# Patient Record
Sex: Female | Born: 1945 | Race: White
Health system: Southern US, Community
[De-identification: ages and names within clinical notes are randomized; demographics above are authoritative.]

---

## 2013-08-27 ENCOUNTER — Other Ambulatory Visit: Payer: Self-pay | Admitting: Neurosurgery

## 2013-08-27 DIAGNOSIS — M47812 Spondylosis without myelopathy or radiculopathy, cervical region: Secondary | ICD-10-CM

## 2013-09-07 ENCOUNTER — Ambulatory Visit
Admission: RE | Admit: 2013-09-07 | Discharge: 2013-09-07 | Disposition: A | Discharge status: Home / Self Care | Payer: Medicare Other | Source: Ambulatory Visit | Attending: Neurosurgery | Admitting: Neurosurgery

## 2013-09-07 DIAGNOSIS — M47812 Spondylosis without myelopathy or radiculopathy, cervical region: Secondary | ICD-10-CM

## 2015-01-12 IMAGING — CR DG CERVICAL SPINE 2 OR 3 VIEWS
2 series · 2 of 2 positions shown · non-contrast
Comparison: None.

CLINICAL DATA: Bilateral upper extremity tingling without history
of trauma

EXAM:
CERVICAL SPINE - 2-3 VIEW

[view not recorded (1 of 2)]
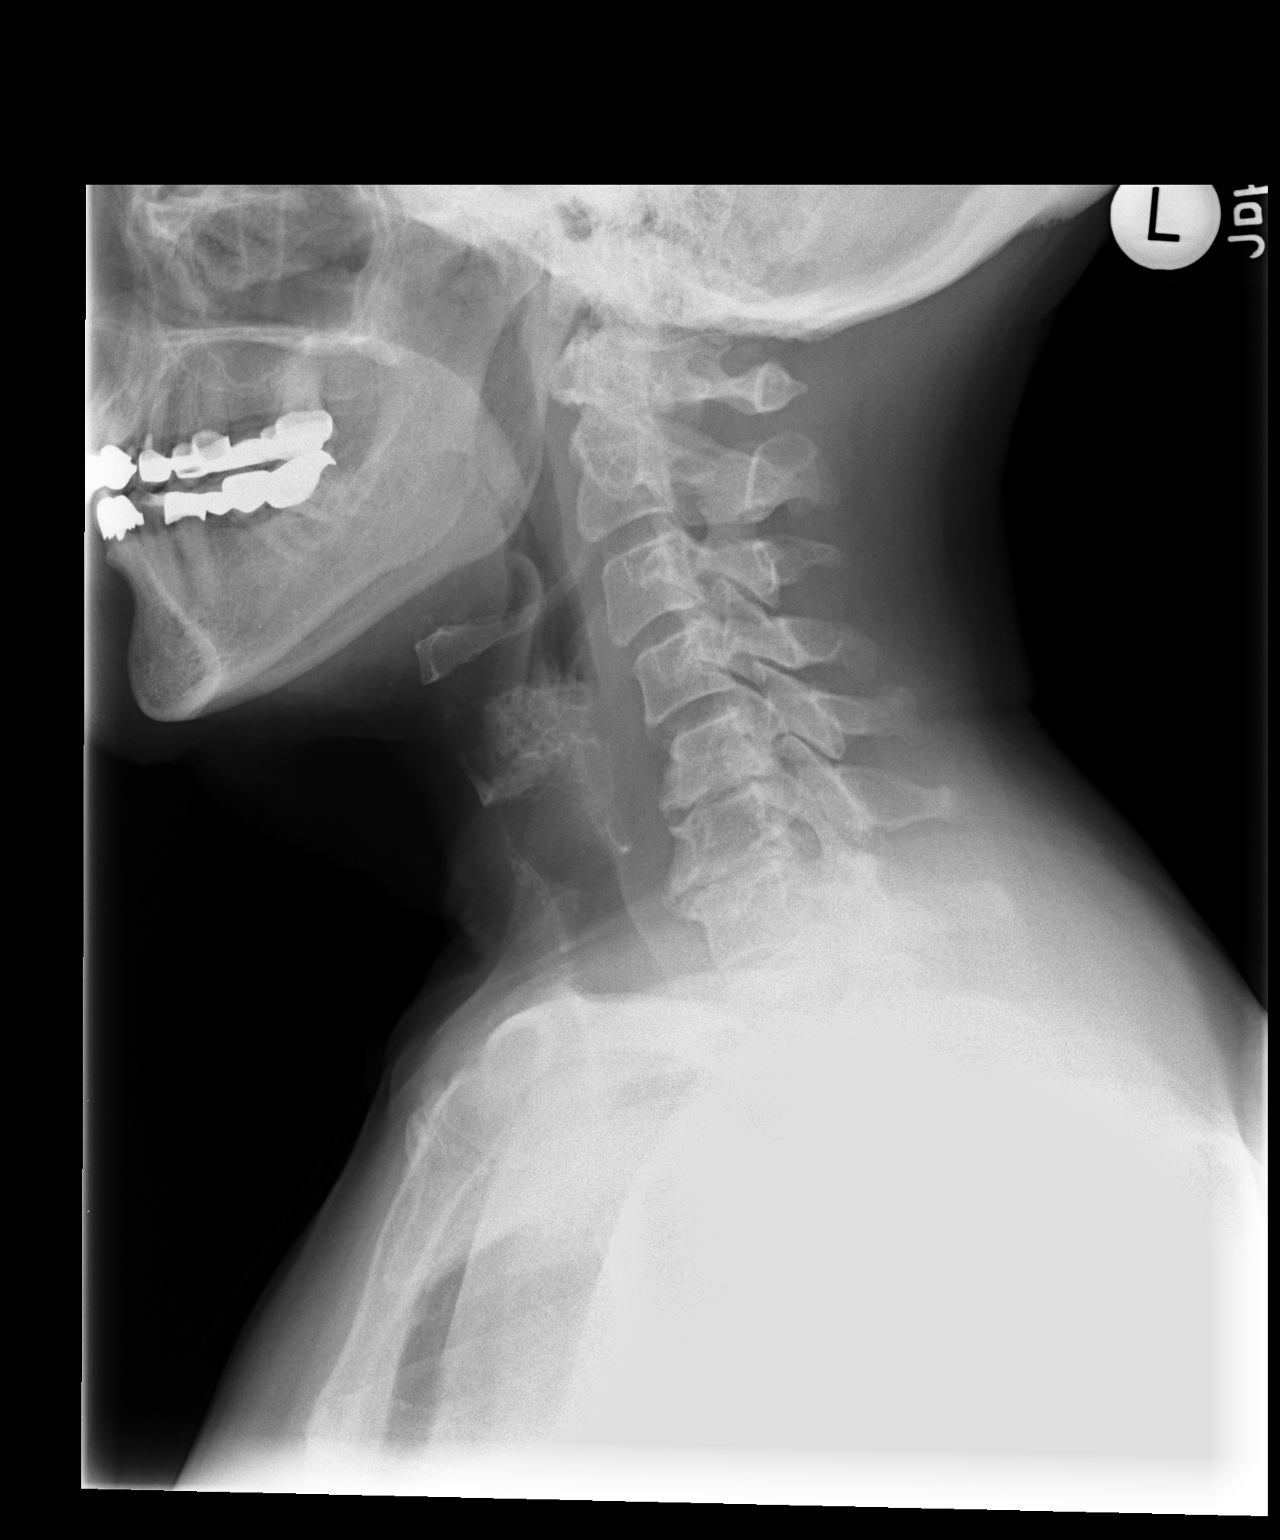

[view not recorded (2 of 2)]
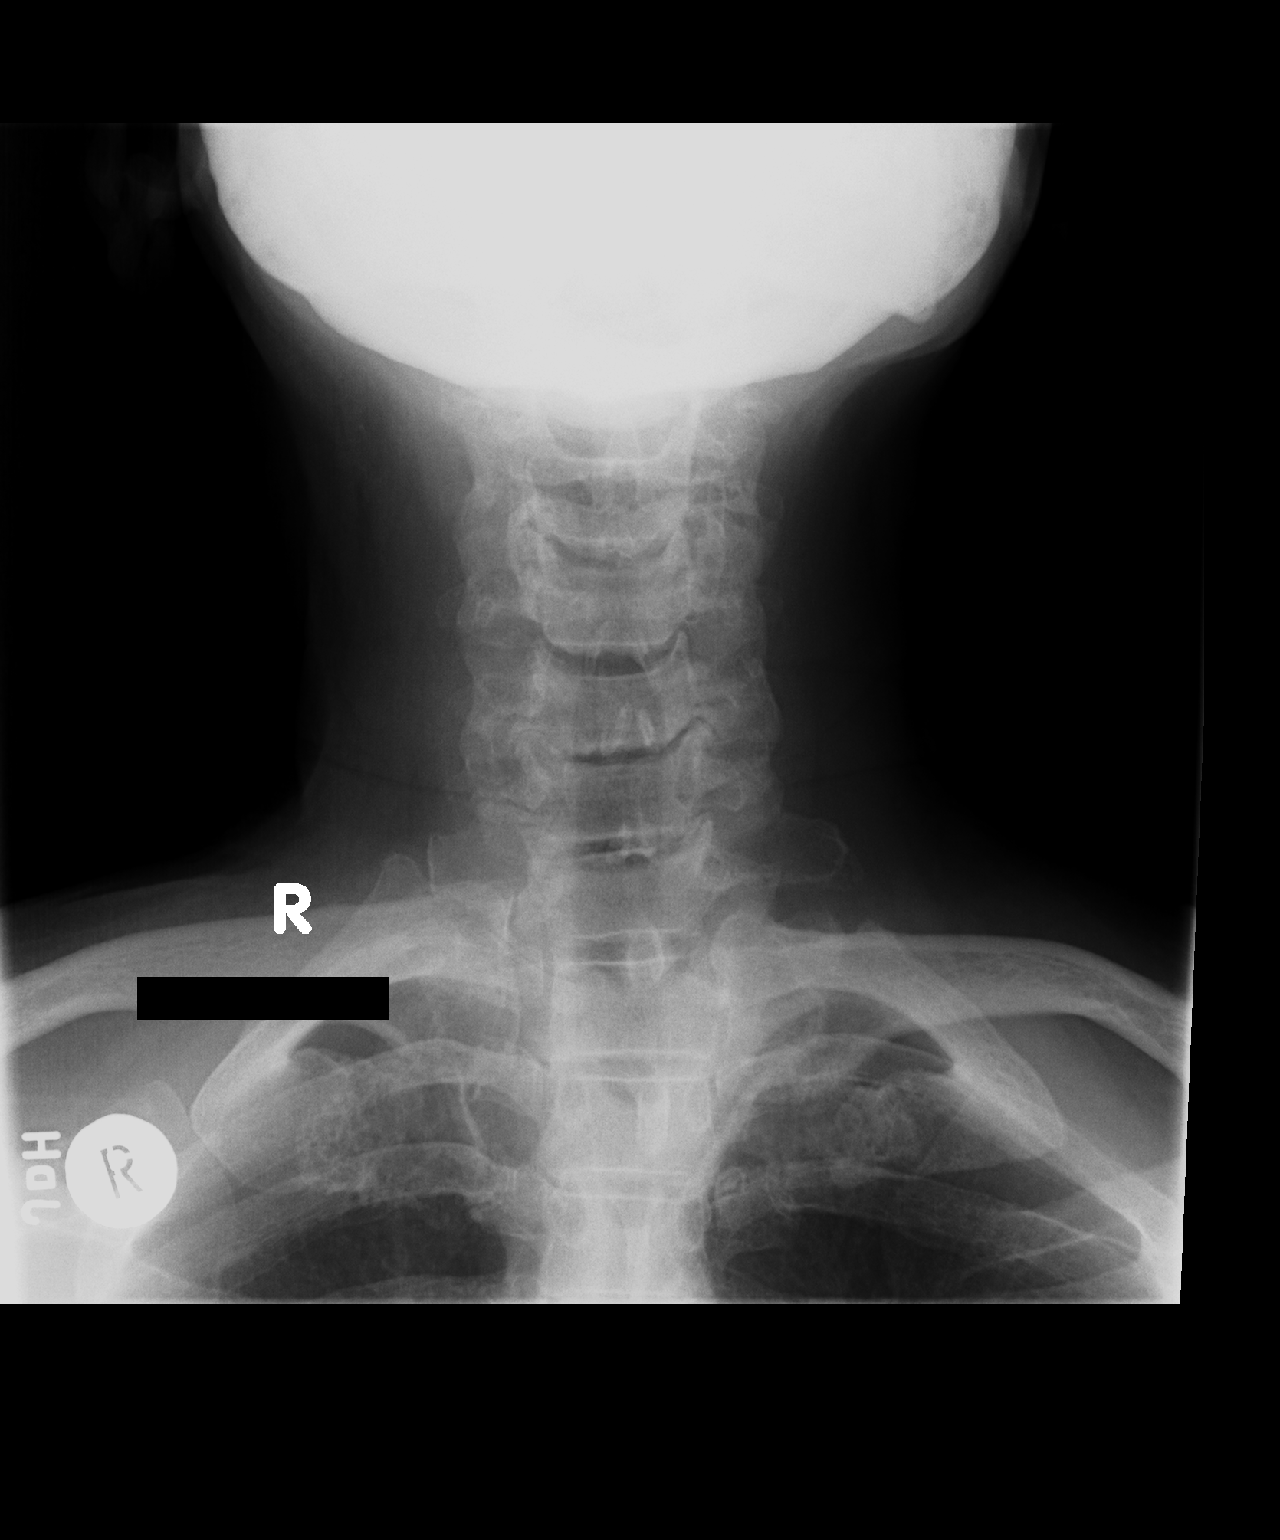

[2 of 2 positions shown; findings below may reference images not displayed]

FINDINGS: There is mild loss of the normal cervical lordosis. There is
moderate to severe did disc space narrowing at C5-6 and C6-7. There
are large anterior endplate osteophytes at these levels. The
prevertebral soft tissue spaces are normal. There is no perched
facet.
IMPRESSION: There is degenerative disc change at C5-6 and C6-7.
